# Patient Record
Sex: Male | Born: 1975 | Race: White | Hispanic: No | Marital: Single | Smoking: Former smoker
Health system: Southern US, Community
[De-identification: ages and names within clinical notes are randomized; demographics above are authoritative.]

---

## 2008-12-09 ENCOUNTER — Emergency Department (HOSPITAL_COMMUNITY): Admission: EM | Admit: 2008-12-09 | Discharge: 2008-12-09 | Payer: Self-pay | Admitting: Emergency Medicine

## 2009-08-26 ENCOUNTER — Emergency Department (HOSPITAL_COMMUNITY): Admission: EM | Admit: 2009-08-26 | Discharge: 2009-08-27 | Payer: Self-pay | Admitting: Emergency Medicine

## 2010-11-07 LAB — POCT I-STAT, CHEM 8
BUN: 12 mg/dL (ref 6–23)
Calcium, Ion: 1.1 mmol/L — ABNORMAL LOW (ref 1.12–1.32)
Creatinine, Ser: 1.2 mg/dL (ref 0.4–1.5)
HCT: 44 % (ref 39.0–52.0)
Hemoglobin: 15 g/dL (ref 13.0–17.0)
TCO2: 25 mmol/L (ref 0–100)

## 2011-08-05 ENCOUNTER — Ambulatory Visit (INDEPENDENT_AMBULATORY_CARE_PROVIDER_SITE_OTHER): Payer: BC Managed Care – PPO

## 2011-08-05 DIAGNOSIS — R05 Cough: Secondary | ICD-10-CM

## 2011-08-05 DIAGNOSIS — J45909 Unspecified asthma, uncomplicated: Secondary | ICD-10-CM

## 2011-11-04 ENCOUNTER — Encounter: Payer: Self-pay | Admitting: *Deleted

## 2011-11-04 ENCOUNTER — Ambulatory Visit (INDEPENDENT_AMBULATORY_CARE_PROVIDER_SITE_OTHER): Payer: BC Managed Care – PPO | Admitting: Internal Medicine

## 2011-11-04 VITALS — BP 116/80 | HR 71 | Temp 98.4°F | Resp 16 | Ht 74.0 in | Wt 229.0 lb

## 2011-11-04 DIAGNOSIS — F17200 Nicotine dependence, unspecified, uncomplicated: Secondary | ICD-10-CM

## 2011-11-04 DIAGNOSIS — J209 Acute bronchitis, unspecified: Secondary | ICD-10-CM

## 2011-11-04 DIAGNOSIS — J40 Bronchitis, not specified as acute or chronic: Secondary | ICD-10-CM

## 2011-11-04 MED ORDER — AZITHROMYCIN 250 MG PO TABS: ORAL_TABLET | ORAL | Status: AC

## 2011-11-04 NOTE — Progress Notes (Signed)
  Subjective:    Patient ID: Sean Roth, male    DOB: 23-Mar-1976, 36 y.o.   MRN: [id number]  Cough This is a new problem. The current episode started in the past 7 days. The problem has been unchanged. The problem occurs every few minutes. The cough is productive of sputum. Associated symptoms include nasal congestion and rhinorrhea. Pertinent negatives include no chest pain, chills, ear pain, fever, sore throat, shortness of breath or wheezing. The symptoms are aggravated by lying down. Risk factors for lung disease include smoking/tobacco exposure.  Sean Roth comes is for rhinitis and cough.  He has had this for 3 days and noticed his cough is worse, especially at night.  No frank wheezing or chest pain.  Smokes about half a pack of cigs daily.  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  Two years ago was diagnosed with pneumonia after initial treatment with Zpack; he was placed on Avelox and infection resolved.  States he is generally in good health.    Review of Systems  Constitutional: Negative for fever and chills.  HENT: Positive for rhinorrhea. Negative for ear pain and sore throat.   Respiratory: Positive for cough. Negative for shortness of breath and wheezing.   Cardiovascular: Negative for chest pain.  All other systems reviewed and are negative.       Objective:   Physical Exam  Vitals reviewed. Constitutional: He is oriented to person, place, and time. He appears well-developed and well-nourished.  HENT:  Head: Normocephalic.  Mouth/Throat: Oropharynx is clear and moist. No oropharyngeal exudate.       TM's clear bilaterally  Eyes: Conjunctivae are normal.  Cardiovascular: Normal rate, regular rhythm and normal heart sounds.   Pulmonary/Chest: Effort normal. No respiratory distress. He has no wheezes. He has no rales.       Course rhonchii on expiration both lung fields.  Neurological: He is alert and oriented to person, place, and time.  Skin: Skin is warm and dry.    Psychiatric: He has a normal mood and affect. His behavior is normal.          Assessment & Plan:  Bronchitis:  Zpack, and Mucinex during the day.  He has some Tessalon Perles to use at night if needed.  Encouraged to stop smoking!!

## 2011-11-04 NOTE — Patient Instructions (Signed)
Take your Azithromycin as prescribed.  Use Mucinex during the day and stay well hydrated.  Take the Tessalon Perles at night as needed.  Think about quitting smoking!  Bronchitis Bronchitis is the body's way of reacting to injury and/or infection (inflammation) of the bronchi. Bronchi are the air tubes that extend from the windpipe into the lungs. If the inflammation becomes severe, it may cause shortness of breath. CAUSES  Inflammation may be caused by:  A virus.   Germs (bacteria).   Dust.   Allergens.   Pollutants and many other irritants.  The cells lining the bronchial tree are covered with tiny hairs (cilia). These constantly beat upward, away from the lungs, toward the mouth. This keeps the lungs free of pollutants. When these cells become too irritated and are unable to do their job, mucus begins to develop. This causes the characteristic cough of bronchitis. The cough clears the lungs when the cilia are unable to do their job. Without either of these protective mechanisms, the mucus would settle in the lungs. Then you would develop pneumonia. Smoking is a common cause of bronchitis and can contribute to pneumonia. Stopping this habit is the single most important thing you can do to help yourself. TREATMENT   Your caregiver may prescribe an antibiotic if the cough is caused by bacteria. Also, medicines that open up your airways make it easier to breathe. Your caregiver may also recommend or prescribe an expectorant. It will loosen the mucus to be coughed up. Only take over-the-counter or prescription medicines for pain, discomfort, or fever as directed by your caregiver.   Removing whatever causes the problem (smoking, for example) is critical to preventing the problem from getting worse.   Cough suppressants may be prescribed for relief of cough symptoms.   Inhaled medicines may be prescribed to help with symptoms now and to help prevent problems from returning.   For those with  recurrent (chronic) bronchitis, there may be a need for steroid medicines.  SEEK IMMEDIATE MEDICAL CARE IF:   During treatment, you develop more pus-like mucus (purulent sputum).   You have a fever.   Your baby is older than 3 months with a rectal temperature of 102 F (38.9 C) or higher.   Your baby is 23 months old or younger with a rectal temperature of 100.4 F (38 C) or higher.   You become progressively more ill.   You have increased difficulty breathing, wheezing, or shortness of breath.  It is necessary to seek immediate medical care if you are elderly or sick from any other disease. MAKE SURE YOU:   Understand these instructions.   Will watch your condition.   Will get help right away if you are not doing well or get worse.  Document Released: 08/08/2005 Document Revised: 07/28/2011 Document Reviewed: 06/17/2008 [facility] Patient Information 2012 [place], [place].

## 2013-08-02 ENCOUNTER — Ambulatory Visit: Payer: BC Managed Care – PPO

## 2013-08-02 ENCOUNTER — Ambulatory Visit (INDEPENDENT_AMBULATORY_CARE_PROVIDER_SITE_OTHER): Payer: BC Managed Care – PPO | Admitting: Physician Assistant

## 2013-08-02 VITALS — BP 140/98 | HR 79 | Temp 97.9°F | Resp 16 | Ht 73.5 in | Wt 218.6 lb

## 2013-08-02 DIAGNOSIS — R05 Cough: Secondary | ICD-10-CM

## 2013-08-02 DIAGNOSIS — J40 Bronchitis, not specified as acute or chronic: Secondary | ICD-10-CM

## 2013-08-02 DIAGNOSIS — R062 Wheezing: Secondary | ICD-10-CM

## 2013-08-02 DIAGNOSIS — R509 Fever, unspecified: Secondary | ICD-10-CM

## 2013-08-02 MED ORDER — HYDROCODONE-HOMATROPINE 5-1.5 MG/5ML PO SYRP: ORAL_SOLUTION | ORAL | Status: AC

## 2013-08-02 MED ORDER — ALBUTEROL SULFATE HFA 108 (90 BASE) MCG/ACT IN AERS: 2.0000 | INHALATION_SPRAY | RESPIRATORY_TRACT | Status: AC | PRN

## 2013-08-02 MED ORDER — LEVOFLOXACIN 500 MG PO TABS: 500.0000 mg | ORAL_TABLET | Freq: Every day | ORAL | Status: AC

## 2013-08-02 NOTE — Progress Notes (Signed)
Patient ID: Sean Roth MRN: [id number], DOB: Jul 14, 1976, 37 y.o. Date of Encounter: 08/02/2013, 12:03 PM  Primary Physician: No primary provider on file.  Chief Complaint:  Chief Complaint  Patient presents with  . Cough    X 1-2 weeks  . Sore Throat    X last night  . Chest Congestion    X 2-3 weeks    HPI: 37 y.o. male presents with a 2-3 week history of nasal congestion, post nasal drip, sore throat, and cough. Mild sinus pressure. Subjective fever and chills. Nasal congestion thick and green/yellow. Cough is productive of green/yellow sputum and worse at nighttime. Some wheezing. No SOB. Ears feel full, leading to sensation of muffled hearing. Has tried OTC cold preps without success. No GI complaints. Appetite decreased. History of PNA several years ago. Was in the hospital for 1.5 days. No sick contacts, recent antibiotics, or recent travels. No leg trauma, sedentary periods, h/o cancer, or current tobacco use.  No past medical history on file.   Home Meds: Prior to Admission medications   Not on File    Allergies: No Known Allergies  History   Social History  . Marital Status: Single    Spouse Name: N/A    Number of Children: N/A  . Years of Education: N/A   Occupational History  . Not on file.   Social History Main Topics  . Smoking status: Former Smoker -- 0.20 packs/day    Types: Cigarettes    Quit date: 01/31/2013  . Smokeless tobacco: Not on file  . Alcohol Use: Yes  . Drug Use: No  . Sexual Activity: Not on file   Other Topics Concern  . Not on file   Social History Narrative  . No narrative on file     Review of Systems: Constitutional: positive for fever, chills, and fatigue HEENT: see above Cardiovascular: negative for chest pain or palpitations Respiratory: positive for cough and wheezing. negative for shortness of breath Abdominal: negative for abdominal pain, nausea, vomiting or diarrhea Dermatological: negative for  rash Neurologic: positive for headache   Physical Exam: Blood pressure 140/98, pulse 79, temperature 97.9 F (36.6 C), temperature source Oral, resp. rate 16, height 6' 1.5" (1.867 m), weight 218 lb 9.6 oz (99.156 kg), SpO2 96.00%., Body mass index is 28.45 kg/(m^2). General: Well developed, well nourished, in no acute distress. Head: Normocephalic, atraumatic, eyes without discharge, sclera non-icteric, nares are congested. Bilateral auditory canals clear, TM's are without perforation, pearly grey with reflective cone of light bilaterally. No sinus TTP. Oral cavity moist, dentition normal. Posterior pharynx with post nasal drip and mild erythema. No peritonsillar abscess or tonsillar exudate. Uvula midline.  Neck: Supple. No thyromegaly. Full ROM. Lymph nodes: less than 2 cm PC bilaterally. Lungs: Coarse breath sounds bilaterally without wheezes, rales, or rhonchi. Breathing is unlabored.  Heart: RRR with S1 S2. No murmurs, rubs, or gallops appreciated. Msk:  Strength and tone normal for age. Extremities: No clubbing or cyanosis. No edema. Neuro: Alert and oriented X 3. Moves all extremities spontaneously. CNII-XII grossly in tact. Psych:  Responds to questions appropriately with a normal affect.    CXR  [facility] reading (PRIMARY) by  Dr. Cleta Alberts. Negative.    ASSESSMENT AND PLAN:  37 y.o. male with bronchitis and cough.  -Levaquin 500 mg 1 po daily #10 no RF -Hycodan #4oz 1 tsp po q 4-6 hours prn cough no RF SED -Proventil 2 puffs inhaled q 4-6 hours prn #1 no RF -Mucinex -Tylenol/Motrin  prn -Rest/fluids -RTC precautions -RTC 3-5 days if no improvement  Signed, Eula Listen, PA-C [facility] [place], [place] [address] [phone] 08/02/2013 12:03 PM

## 2014-12-09 ENCOUNTER — Emergency Department (HOSPITAL_COMMUNITY): Payer: BLUE CROSS/BLUE SHIELD

## 2014-12-09 ENCOUNTER — Encounter (HOSPITAL_COMMUNITY): Payer: Self-pay | Admitting: *Deleted

## 2014-12-09 ENCOUNTER — Emergency Department (HOSPITAL_COMMUNITY)
Admission: EM | Admit: 2014-12-09 | Discharge: 2014-12-09 | Disposition: A | Discharge status: Home / Self Care | Payer: BLUE CROSS/BLUE SHIELD | Attending: Emergency Medicine | Admitting: Emergency Medicine

## 2014-12-09 DIAGNOSIS — Z87891 Personal history of nicotine dependence: Secondary | ICD-10-CM | POA: Insufficient documentation

## 2014-12-09 DIAGNOSIS — K529 Noninfective gastroenteritis and colitis, unspecified: Secondary | ICD-10-CM | POA: Insufficient documentation

## 2014-12-09 DIAGNOSIS — Z79899 Other long term (current) drug therapy: Secondary | ICD-10-CM | POA: Insufficient documentation

## 2014-12-09 DIAGNOSIS — R109 Unspecified abdominal pain: Secondary | ICD-10-CM | POA: Diagnosis present

## 2014-12-09 DIAGNOSIS — Z792 Long term (current) use of antibiotics: Secondary | ICD-10-CM | POA: Diagnosis not present

## 2014-12-09 LAB — COMPREHENSIVE METABOLIC PANEL
ALBUMIN: 4.7 g/dL (ref 3.5–5.2)
ALT: 27 U/L (ref 0–53)
AST: 25 U/L (ref 0–37)
Alkaline Phosphatase: 72 U/L (ref 39–117)
Anion gap: 10 (ref 5–15)
BILIRUBIN TOTAL: 0.7 mg/dL (ref 0.3–1.2)
BUN: 22 mg/dL (ref 6–23)
CHLORIDE: 106 mmol/L (ref 96–112)
CO2: 20 mmol/L (ref 19–32)
Calcium: 9.5 mg/dL (ref 8.4–10.5)
Creatinine, Ser: 1.13 mg/dL (ref 0.50–1.35)
GFR calc Af Amer: >90 mL/min (ref >90)
GFR, EST NON AFRICAN AMERICAN: 82 mL/min — AB (ref >90)
Glucose, Bld: 115 mg/dL — ABNORMAL HIGH (ref 70–99)
POTASSIUM: 4.3 mmol/L (ref 3.5–5.1)
SODIUM: 136 mmol/L (ref 135–145)
Total Protein: 8.1 g/dL (ref 6.0–8.3)

## 2014-12-09 LAB — I-STAT CG4 LACTIC ACID, ED: LACTIC ACID, VENOUS: 2.55 mmol/L — AB (ref 0.5–2.0)

## 2014-12-09 LAB — CBC WITH DIFFERENTIAL/PLATELET
Basophils Absolute: 0 10*3/uL (ref 0.0–0.1)
Basophils Relative: 0 % (ref 0–1)
Eosinophils Absolute: 0.2 10*3/uL (ref 0.0–0.7)
Eosinophils Relative: 1 % (ref 0–5)
HCT: 47.2 % (ref 39.0–52.0)
HEMOGLOBIN: 16.8 g/dL (ref 13.0–17.0)
LYMPHS ABS: 0.9 10*3/uL (ref 0.7–4.0)
Lymphocytes Relative: 5 % — ABNORMAL LOW (ref 12–46)
MCH: 30.4 pg (ref 26.0–34.0)
MCHC: 35.6 g/dL (ref 30.0–36.0)
MCV: 85.4 fL (ref 78.0–100.0)
MONOS PCT: 6 % (ref 3–12)
Monocytes Absolute: 1 10*3/uL (ref 0.1–1.0)
NEUTROS ABS: 14 10*3/uL — AB (ref 1.7–7.7)
NEUTROS PCT: 88 % — AB (ref 43–77)
PLATELETS: 243 10*3/uL (ref 150–400)
RBC: 5.53 MIL/uL (ref 4.22–5.81)
RDW: 12.5 % (ref 11.5–15.5)
WBC: 16.1 10*3/uL — AB (ref 4.0–10.5)

## 2014-12-09 LAB — LIPASE, BLOOD: LIPASE: 24 U/L (ref 11–59)

## 2014-12-09 LAB — CLOSTRIDIUM DIFFICILE BY PCR: CDIFFPCR: NEGATIVE

## 2014-12-09 MED ORDER — DIPHENOXYLATE-ATROPINE 2.5-0.025 MG PO TABS: 1.0000 | ORAL_TABLET | Freq: Four times a day (QID) | ORAL | Status: AC | PRN

## 2014-12-09 MED ORDER — MORPHINE SULFATE 4 MG/ML IJ SOLN
4.0000 mg | INTRAMUSCULAR | Status: DC | PRN
  Administered 2014-12-09 (×2): 4 mg via INTRAVENOUS
  Filled 2014-12-09 (×2): qty 1

## 2014-12-09 MED ORDER — ONDANSETRON HCL 4 MG/2ML IJ SOLN
4.0000 mg | Freq: Once | INTRAMUSCULAR | Status: AC
  Administered 2014-12-09: 4 mg via INTRAVENOUS

## 2014-12-09 MED ORDER — SODIUM CHLORIDE 0.9 % IV BOLUS (SEPSIS)
1000.0000 mL | Freq: Once | INTRAVENOUS | Status: AC
  Administered 2014-12-09: 1000 mL via INTRAVENOUS

## 2014-12-09 MED ORDER — DICYCLOMINE HCL 20 MG PO TABS: 20.0000 mg | ORAL_TABLET | Freq: Two times a day (BID) | ORAL | Status: AC

## 2014-12-09 MED ORDER — ONDANSETRON HCL 4 MG/2ML IJ SOLN
4.0000 mg | Freq: Once | INTRAMUSCULAR | Status: DC
  Filled 2014-12-09: qty 2

## 2014-12-09 MED ORDER — ONDANSETRON 4 MG PO TBDP: 4.0000 mg | ORAL_TABLET | Freq: Three times a day (TID) | ORAL | Status: DC | PRN

## 2014-12-09 NOTE — ED Notes (Addendum)
Pt reports was on cephelexin for strep throat, finished last dose on Friday 4/15. Left sided flank pain, vomiting and diarrhea since this morning. Pain 10/10. Diarrhea episodes 15x since 0500. Actively vomiting in triage.

## 2014-12-09 NOTE — ED Provider Notes (Signed)
CSN: [id number]     Arrival date & time 12/09/14  [id number] History   First MD Initiated Contact with Patient 12/09/14 [phone]     Chief Complaint  Patient presents with  . Flank Pain  . Diarrhea  . Emesis      HPI  Patient presents for evaluation of abdominal pain. Also reports nausea vomiting diarrhea since early this morning. Several days ago finished 10 days of Keflex for a sore throat. Describes severe abdominal cramping. Nonlocalizing. Hurts to walk. No relief with bowel movement or emesis. Denies blood pus or mucus in his stools. Heme-negative nonbilious emesis. Pain is more left-sided than right. Has never had an episode of colitis, or known diverticuli.  History reviewed. No pertinent past medical history. History reviewed. No pertinent past surgical history. History reviewed. No pertinent family history. History  Substance Use Topics  . Smoking status: Former Smoker -- 0.20 packs/day    Types: Cigarettes    Quit date: 01/31/2013  . Smokeless tobacco: Not on file  . Alcohol Use: Yes    Review of Systems  Constitutional: Negative for fever, chills, diaphoresis, appetite change and fatigue.  HENT: Negative for mouth sores, sore throat and trouble swallowing.   Eyes: Negative for visual disturbance.  Respiratory: Negative for cough, chest tightness, shortness of breath and wheezing.   Cardiovascular: Negative for chest pain.  Gastrointestinal: Positive for nausea, vomiting, abdominal pain and diarrhea. Negative for abdominal distention.  Endocrine: Negative for polydipsia, polyphagia and polyuria.  Genitourinary: Negative for dysuria, frequency and hematuria.  Musculoskeletal: Negative for gait problem.  Skin: Negative for color change, pallor and rash.  Neurological: Negative for dizziness, syncope, light-headedness and headaches.  Hematological: Does not bruise/bleed easily.  Psychiatric/Behavioral: Negative for behavioral problems and confusion.      Allergies  Review  of patient's allergies indicates no known allergies.  Home Medications   Prior to Admission medications   Medication Sig Start Date End Date Taking? Authorizing Provider  ibuprofen (ADVIL,MOTRIN) 200 MG tablet Take 400 mg by mouth every 4 (four) hours as needed (for pain).   Yes Historical Provider, MD  terbinafine (LAMISIL) 250 MG tablet Take 250 mg by mouth daily.   Yes Historical Provider, MD  albuterol (PROVENTIL HFA;VENTOLIN HFA) 108 (90 BASE) MCG/ACT inhaler Inhale 2 puffs into the lungs every 4 (four) hours as needed for wheezing or shortness of breath. Patient not taking: Reported on 12/09/2014 08/02/13   Raymon Muttonyan M Dunn, PA-C  dicyclomine (BENTYL) 20 MG tablet Take 1 tablet (20 mg total) by mouth 2 (two) times daily. 12/09/14   Rolland PorterMark Rashard Ryle, MD  diphenoxylate-atropine (LOMOTIL) 2.5-0.025 MG per tablet Take 1 tablet by mouth 4 (four) times daily as needed for diarrhea or loose stools. 12/09/14   Rolland PorterMark Shivan Hodes, MD  HYDROcodone-homatropine [facility](HYCODAN) 5-1.5 MG/5ML syrup 1 TSP PO Q 4-6 HOURS PRN COUGH Patient not taking: Reported on 12/09/2014 08/02/13   Raymon Muttonyan M Dunn, PA-C  levofloxacin (LEVAQUIN) 500 MG tablet Take 1 tablet (500 mg total) by mouth daily. Patient not taking: Reported on 12/09/2014 08/02/13   Raymon Muttonyan M Dunn, PA-C  ondansetron (ZOFRAN ODT) 4 MG disintegrating tablet Take 1 tablet (4 mg total) by mouth every 8 (eight) hours as needed for nausea. 12/09/14   Rolland PorterMark Luie Laneve, MD   BP 129/71 mmHg  Pulse 110  Temp(Src) 97.6 F (36.4 C) (Oral)  Resp 18  SpO2 94% Physical Exam  Constitutional: He is oriented to person, place, and time. He appears well-developed and well-nourished. No distress.  HENT:  Head: Normocephalic.  Eyes: Conjunctivae are normal. Pupils are equal, round, and reactive to light. No scleral icterus.  Neck: Normal range of motion. Neck supple. No thyromegaly present.  Cardiovascular: Normal rate and regular rhythm.  Exam reveals no gallop and no friction rub.   No murmur  heard. Pulmonary/Chest: Effort normal and breath sounds normal. No respiratory distress. He has no wheezes. He has no rales.  Abdominal: Soft. Bowel sounds are normal. He exhibits no distension. There is no tenderness. There is no rebound.    Musculoskeletal: Normal range of motion.  Neurological: He is alert and oriented to person, place, and time.  Skin: Skin is warm and dry. No rash noted.  Psychiatric: He has a normal mood and affect. His behavior is normal.    ED Course  Procedures (including critical care time) Labs Review Labs Reviewed  CBC WITH DIFFERENTIAL/PLATELET - Abnormal; Notable for the following:    WBC 16.1 (*)    Neutrophils Relative % 88 (*)    Neutro Abs 14.0 (*)    Lymphocytes Relative 5 (*)    All other components within normal limits  COMPREHENSIVE METABOLIC PANEL - Abnormal; Notable for the following:    Glucose, Bld 115 (*)    GFR calc non Af Amer 82 (*)    All other components within normal limits  I-STAT CG4 LACTIC ACID, ED - Abnormal; Notable for the following:    Lactic Acid, Venous 2.55 (*)    All other components within normal limits  CLOSTRIDIUM DIFFICILE BY PCR  LIPASE, BLOOD    Imaging Review No results found.   EKG Interpretation None      MDM   Final diagnoses:  AP (abdominal pain)  Gastroenteritis     Patient given antiemetics and fluids. Has a leukocytosis. Continues with pain although somewhat intermittent now. CT scan shows signs of enteritis. No signs of localizing colitis, or diverticuli. No free fluid or free air. Plan symptomatic treatment. Discharge home, Zofran, Lomotil, Bentyl and clear liquids, slowly advancing his diet. Recheck with any worsening symptoms including worsening pain, excessive vomiting. Bloody stools, bloody emesis high fever or other changes.   Rolland Porter, MD 12/11/14 1728

## 2014-12-09 NOTE — Discharge Instructions (Signed)
Viral Gastroenteritis Viral gastroenteritis is also called stomach flu. This illness is caused by a certain type of germ (virus). It can cause sudden watery poop (diarrhea) and throwing up (vomiting). This can cause you to lose body fluids (dehydration). This illness usually lasts for 3 to 8 days. It usually goes away on its own. HOME CARE   Drink enough fluids to keep your pee (urine) clear or pale yellow. Drink small amounts of fluids often.  Ask your doctor how to replace body fluid losses (rehydration).  Avoid:  Foods high in sugar.  Alcohol.  Bubbly (carbonated) drinks.  Tobacco.  Juice.  Caffeine drinks.  Very hot or cold fluids.  Fatty, greasy foods.  Eating too much at one time.  Dairy products until 24 to 48 hours after your watery poop stops.  You may eat foods with active cultures (probiotics). They can be found in some yogurts and supplements.  Wash your hands well to avoid spreading the illness.  Only take medicines as told by your doctor. Do not give aspirin to children. Do not take medicines for watery poop (antidiarrheals).  Ask your doctor if you should keep taking your regular medicines.  Keep all doctor visits as told. GET HELP RIGHT AWAY IF:   You cannot keep fluids down.  You do not pee at least once every 6 to 8 hours.  You are short of breath.  You see blood in your poop or throw up. This may look like coffee grounds.  You have belly (abdominal) pain that gets worse or is just in one small spot (localized).  You keep throwing up or having watery poop.  You have a fever.  The patient is a child younger than 3 months, and he or she has a fever.  The patient is a child older than 3 months, and he or she has a fever and problems that do not go away.  The patient is a child older than 3 months, and he or she has a fever and problems that suddenly get worse.  The patient is a baby, and he or she has no tears when crying. MAKE SURE YOU:     Understand these instructions.  Will watch your condition.  Will get help right away if you are not doing well or get worse. Document Released: 01/25/2008 Document Revised: 10/31/2011 Document Reviewed: 05/25/2011 [employer] Patient Information 2015 [employer]. This information is not intended to replace advice given to you by your health care provider. Make sure you discuss any questions you have with your health care provider.  

## 2014-12-09 NOTE — ED Notes (Signed)
Patient unable to urinate at this time.  Will attempt again shortly

## 2017-10-31 ENCOUNTER — Ambulatory Visit
Admission: RE | Admit: 2017-10-31 | Discharge: 2017-10-31 | Disposition: A | Discharge status: Home / Self Care | Payer: BLUE CROSS/BLUE SHIELD | Source: Ambulatory Visit | Attending: Family Medicine | Admitting: Family Medicine

## 2017-10-31 ENCOUNTER — Other Ambulatory Visit: Payer: Self-pay | Admitting: Family Medicine

## 2017-10-31 DIAGNOSIS — R52 Pain, unspecified: Secondary | ICD-10-CM

## 2017-12-29 ENCOUNTER — Encounter (HOSPITAL_COMMUNITY): Payer: Self-pay

## 2017-12-29 ENCOUNTER — Emergency Department (HOSPITAL_COMMUNITY)
Admission: EM | Admit: 2017-12-29 | Discharge: 2017-12-29 | Disposition: A | Discharge status: Home / Self Care | Payer: BLUE CROSS/BLUE SHIELD | Attending: Emergency Medicine | Admitting: Emergency Medicine

## 2017-12-29 DIAGNOSIS — Z87891 Personal history of nicotine dependence: Secondary | ICD-10-CM | POA: Insufficient documentation

## 2017-12-29 DIAGNOSIS — L03211 Cellulitis of face: Secondary | ICD-10-CM | POA: Insufficient documentation

## 2017-12-29 DIAGNOSIS — Z79899 Other long term (current) drug therapy: Secondary | ICD-10-CM | POA: Insufficient documentation

## 2017-12-29 MED ORDER — SULFAMETHOXAZOLE-TRIMETHOPRIM 800-160 MG PO TABS: 1.0000 | ORAL_TABLET | Freq: Two times a day (BID) | ORAL | 0 refills | Status: AC

## 2017-12-29 MED ORDER — CEPHALEXIN 500 MG PO CAPS: 500.0000 mg | ORAL_CAPSULE | Freq: Four times a day (QID) | ORAL | 0 refills | Status: AC

## 2017-12-29 MED ORDER — ACETAMINOPHEN 500 MG PO TABS
1000.0000 mg | ORAL_TABLET | Freq: Once | ORAL | Status: AC
  Administered 2017-12-29: 1000 mg via ORAL
  Filled 2017-12-29: qty 2

## 2017-12-29 NOTE — ED Provider Notes (Signed)
[facility] EMERGENCY DEPARTMENT Provider Note   CSN: [id number] Arrival date & time: 12/29/17  1249     History   Chief Complaint Chief Complaint  Patient presents with  . Facial Swelling    HPI Sean Roth is a 42 y.o. male.  HPI  Patient is a 42 year old male with no significant past medical history presenting for draining lesion of the lower lip and surrounding redness.  Patient reports that he had a small lesion that "popped" on his lower lip yesterday, and had a small amount of purulent drainage.  Patient reports subsequent serous fluid and crusting.  Patient denies any lesions of the mouth, other lesions of the body, desquamating rashes, or taking any medications.  Patient denies any fever or chills.  Patient denies shortness of breath, abdominal pain, urticaria.  Patient denies IVDU.  No remedies tried for symptoms.  Tetanus shot is up-to-date.  History reviewed. No pertinent past medical history.  There are no active problems to display for this patient.   History reviewed. No pertinent surgical history.      Home Medications    Prior to Admission medications   Medication Sig Start Date End Date Taking? Authorizing Provider  albuterol (PROVENTIL HFA;VENTOLIN HFA) 108 (90 BASE) MCG/ACT inhaler Inhale 2 puffs into the lungs every 4 (four) hours as needed for wheezing or shortness of breath. Patient not taking: Reported on 12/09/2014 08/02/13   Sondra Barges, PA-C  dicyclomine (BENTYL) 20 MG tablet Take 1 tablet (20 mg total) by mouth 2 (two) times daily. 12/09/14   Rolland Porter, MD  diphenoxylate-atropine (LOMOTIL) 2.5-0.025 MG per tablet Take 1 tablet by mouth 4 (four) times daily as needed for diarrhea or loose stools. 12/09/14   Rolland Porter, MD  HYDROcodone-homatropine [facility]) 5-1.5 MG/5ML syrup 1 TSP PO Q 4-6 HOURS PRN COUGH Patient not taking: Reported on 12/09/2014 08/02/13   Sondra Barges, PA-C  ibuprofen (ADVIL,MOTRIN) 200 MG tablet Take 400 mg  by mouth every 4 (four) hours as needed (for pain).    [provider]  levofloxacin (LEVAQUIN) 500 MG tablet Take 1 tablet (500 mg total) by mouth daily. Patient not taking: Reported on 12/09/2014 08/02/13   Sondra Barges, PA-C  ondansetron (ZOFRAN ODT) 4 MG disintegrating tablet Take 1 tablet (4 mg total) by mouth every 8 (eight) hours as needed for nausea. 12/09/14   Rolland Porter, MD  terbinafine (LAMISIL) 250 MG tablet Take 250 mg by mouth daily.    [provider]    Family History No family history on file.  Social History Social History   Tobacco Use  . Smoking status: Former Smoker    Packs/day: 0.20    Types: Cigarettes    Last attempt to quit: 01/31/2013    Years since quitting: 4.9  Substance Use Topics  . Alcohol use: Yes  . Drug use: No     Allergies   Patient has no known allergies.   Review of Systems Review of Systems  Constitutional: Negative for chills and fever.  HENT: Positive for facial swelling.   Skin: Positive for color change.     Physical Exam Updated Vital Signs BP 116/89 (BP Location: Right Arm)   Pulse 85   Temp 98.5 F (36.9 C) (Oral)   Resp 16   SpO2 99%   Physical Exam  Constitutional: He appears well-developed and well-nourished. No distress.  Sitting comfortably in bed.  HENT:  Head: Normocephalic and atraumatic.  There is a swollen  area inferior to the lower lip on the right with overlying crusting.  Some serous drainage.  No fluctuance noted.  Induration of the entire nodule.  There are no intraoral lesions. No intraoral abscesses.There is no erythema or asymmetric swelling of the face.  Oropharynx is clear without swelling.   Eyes: Conjunctivae are normal. Right eye exhibits no discharge. Left eye exhibits no discharge.  EOMs normal to gross examination.  Neck: Normal range of motion.  Cardiovascular: Normal rate and regular rhythm.  Intact, 2+ radial pulse.  Pulmonary/Chest:  Normal respiratory effort.  Patient converses comfortably. No audible wheeze or stridor.  Abdominal: He exhibits no distension.  Musculoskeletal: Normal range of motion.  Neurological: He is alert.  Cranial nerves intact to gross observation. Patient moves extremities without difficulty.  Skin: Skin is warm and dry. He is not diaphoretic.  Psychiatric: He has a normal mood and affect. His behavior is normal. Judgment and thought content normal.  Nursing note and vitals reviewed.    ED Treatments / Results  Labs (all labs ordered are listed, but only abnormal results are displayed) Labs Reviewed - No data to display  EKG None  Radiology No results found.  Procedures Procedures (including critical care time)  Medications Ordered in ED Medications  acetaminophen (TYLENOL) tablet 1,000 mg (1,000 mg Oral Given 12/29/17 1337)     Initial Impression / Assessment and Plan / ED Course  I have reviewed the triage vital signs and the nursing notes.  Pertinent labs & imaging results that were available during my care of the patient were reviewed by me and considered in my medical decision making (see chart for details).     Patient nontoxic-appearing, afebrile, and in no acute distress.  Airway is intact, and facial swelling does not appear to involve upper lip, or the oropharynx.  Do not suspect angioedema of the lips, as patient is not on any medications that would precipitate this, takes no medicines, and swelling is asymmetric on the lips.  No other allergic symptoms such as shortness of breath, abdominal cramping, or urticaria.  Suspect cellulitis and abscess of face.  Appears actively draining, with no further indication for I&D.  Will prescribe Bactrim/Keflex, and encourage patient to perform warm compresses.  Return precautions given for any swelling of the mouth or face, redevelopment of abscess, or spreading erythema.  Patient is in understanding and agrees with the plan of care.  Final Clinical  Impressions(s) / ED Diagnoses   Final diagnoses:  Facial cellulitis    ED Discharge Orders        Ordered    sulfamethoxazole-trimethoprim (BACTRIM DS,SEPTRA DS) 800-160 MG tablet  2 times daily     12/29/17 1628    cephALEXin (KEFLEX) 500 MG capsule  4 times daily     12/29/17 1628       Delia Chimes 12/29/17 1946    Nira Conn, MD 12/30/17 [phone]

## 2017-12-29 NOTE — ED Triage Notes (Signed)
Pt presents for lip swelling. States he woke up with swelling this AM. No new foods or medications. States he felt he had a small bump to R lower lip last night but no swelling. States lip is painful. Patient has some drainage from lip and chin. Reports pain with swallowing. VSS.

## 2017-12-29 NOTE — Discharge Instructions (Addendum)
Please see the information and instructions below regarding your visit.  Your diagnoses today include:  1. Facial cellulitis    Cellulitis is a superficial skin infection. Please take your antibiotics as prescribed for their ENTIRE prescribed duration.   Tests performed today include: See side panel of your discharge paperwork for testing performed today. Vital signs are listed at the bottom of these instructions.   Medications prescribed:    Take any prescribed medications only as prescribed, and any over the counter medications only as directed on the packaging.  1. Please take all of your antibiotics until finished.   You may develop abdominal discomfort or nausea from the antibiotic. If this occurs, you may take it with food. Some patients also get diarrhea with antibiotics. You may help offset this with probiotics which you can buy or get in yogurt. Do not eat or take the probiotics until 2 hours after your antibiotic. Some women develop vaginal yeast infections after antibiotics. If you develop unusual vaginal discharge after being on this medication, please see your primary care provider.   Some people develop allergies to antibiotics. Symptoms of antibiotic allergy can be mild and include a flat rash and itching. They can also be more serious and include:  ?Hives - Hives are raised, red patches of skin that are usually very itchy.  ?Lip or tongue swelling  ?Trouble swallowing or breathing  ?Blistering of the skin or mouth.  If you have any of these serious symptoms, please seek emergency medical care immediately.  Home care instructions:  Please follow any educational materials contained in this packet.    Keep affected area above the level of your heart when possible. Wash area gently twice a day with warm soapy water. Do not apply alcohol or hydrogen peroxide. Cover the area if it draining or weeping.   Please apply warm compresses 4 times daily on this area to promote  drainage.  Follow-up instructions: Please follow-up with your primary care provider or the ED in 48 hours for a check of the infection if symptoms are not improving.   Return instructions:  Please return to the Emergency Department if you experience worsening symptoms. Call your doctor sooner or return to the ER if you develop worsening signs of infection such as: increased redness, increased pain, pus, fever, or other symptoms that concern you. Please monitor the area we marked with a pen today. Please return if you have any other emergent concerns.  Additional Information:   Your vital signs today were: BP 116/89 (BP Location: Right Arm)    Pulse 85    Temp 98.5 F (36.9 C) (Oral)    Resp 16    SpO2 99%  If your blood pressure (BP) was elevated on multiple readings during this visit above 130 for the top number or above 80 for the bottom number, please have this repeated by your primary care provider within one month. --------------  Thank you for allowing [place] to participate in your care today. It was a pleasure taking care of you today!

## 2017-12-29 NOTE — ED Provider Notes (Signed)
Patient placed in Quick Look pathway, seen and evaluated   Chief Complaint: Facial swelling  HPI:   Patient presents today for evaluation of right-sided lip swelling.  He reports that he had a small bump on his right lower lip last night however there was not swelling.  He reports that when he woke up this morning it was swollen, and painful.  He has wounds on his lip and chin with honey colored crusting.   ROS: No fevers or chills  Physical Exam:   Gen: No distress  Neuro: Awake and Alert  Skin: Warm    Focused Exam: Right lower lip is swollen with generalized area of tenderness to palpation, hot honey colored crusting, and open/draining wounds.  No obvious purulent drainage.  There is mucous membrane disruption on the right buccal mucosa.  Patient refused clinical image capture.   Initiation of care has begun. The patient has been counseled on the process, plan, and necessity for staying for the completion/evaluation, and the remainder of the medical screening examination.  He was informed that leaving after this evaluation would be considered AMA.     Cristina Gong, PA-C 12/29/17 1345    Eber Hong, MD 12/30/17 [phone]

## 2019-07-06 ENCOUNTER — Emergency Department (HOSPITAL_COMMUNITY): Payer: No Typology Code available for payment source

## 2019-07-06 ENCOUNTER — Emergency Department (HOSPITAL_COMMUNITY)
Admission: EM | Admit: 2019-07-06 | Discharge: 2019-07-07 | Disposition: A | Discharge status: Home / Self Care | Payer: No Typology Code available for payment source | Attending: Emergency Medicine | Admitting: Emergency Medicine

## 2019-07-06 ENCOUNTER — Other Ambulatory Visit: Payer: Self-pay

## 2019-07-06 ENCOUNTER — Encounter (HOSPITAL_COMMUNITY): Payer: Self-pay | Admitting: Emergency Medicine

## 2019-07-06 DIAGNOSIS — Z79899 Other long term (current) drug therapy: Secondary | ICD-10-CM | POA: Insufficient documentation

## 2019-07-06 DIAGNOSIS — M25562 Pain in left knee: Secondary | ICD-10-CM | POA: Insufficient documentation

## 2019-07-06 DIAGNOSIS — Y999 Unspecified external cause status: Secondary | ICD-10-CM | POA: Diagnosis not present

## 2019-07-06 DIAGNOSIS — Y939 Activity, unspecified: Secondary | ICD-10-CM | POA: Insufficient documentation

## 2019-07-06 DIAGNOSIS — S8001XA Contusion of right knee, initial encounter: Secondary | ICD-10-CM | POA: Diagnosis not present

## 2019-07-06 DIAGNOSIS — S50311A Abrasion of right elbow, initial encounter: Secondary | ICD-10-CM | POA: Diagnosis not present

## 2019-07-06 DIAGNOSIS — Z23 Encounter for immunization: Secondary | ICD-10-CM | POA: Insufficient documentation

## 2019-07-06 DIAGNOSIS — S81032A Puncture wound without foreign body, left knee, initial encounter: Secondary | ICD-10-CM

## 2019-07-06 DIAGNOSIS — S71131A Puncture wound without foreign body, right thigh, initial encounter: Secondary | ICD-10-CM | POA: Diagnosis not present

## 2019-07-06 DIAGNOSIS — Y929 Unspecified place or not applicable: Secondary | ICD-10-CM | POA: Insufficient documentation

## 2019-07-06 DIAGNOSIS — Z87891 Personal history of nicotine dependence: Secondary | ICD-10-CM | POA: Insufficient documentation

## 2019-07-06 MED ORDER — TETANUS-DIPHTH-ACELL PERTUSSIS 5-2.5-18.5 LF-MCG/0.5 IM SUSP
0.5000 mL | Freq: Once | INTRAMUSCULAR | Status: AC
  Administered 2019-07-06: 0.5 mL via INTRAMUSCULAR
  Filled 2019-07-06: qty 0.5

## 2019-07-06 NOTE — ED Notes (Signed)
Patient transported to X-ray 
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
## 2019-07-07 MED ORDER — ACETAMINOPHEN 500 MG PO TABS: 1000.0000 mg | ORAL_TABLET | Freq: Three times a day (TID) | ORAL | 0 refills | Status: AC | PRN

## 2019-07-07 MED ORDER — CEPHALEXIN 500 MG PO CAPS: 500.0000 mg | ORAL_CAPSULE | Freq: Four times a day (QID) | ORAL | 0 refills | Status: AC

## 2019-07-07 NOTE — Discharge Instructions (Addendum)
Take 1 g of Tylenol every 8 hours for your pain.  Avoid NSAID medications such as ibuprofen, Aleve, Advil, Motrin considering your hematoma and this can cause increase in bleeding.  Use ice 3-4 times daily alternating 20 minutes on, 20 minutes off.  Elevate your leg whenever you are not walking on it.  Make sure to walk with crutches and do not bear weight on your leg.  Please follow-up with orthopedics next week for recheck.  Please return the emergency department if you develop any new or worsening symptoms including increasing pain, redness, swelling, red streaking from the wounds, or any other concerning symptoms.

## 2019-07-07 NOTE — ED Notes (Signed)
Patient verbalizes understanding of discharge instructions. Opportunity for questioning and answers were provided. Armband removed by staff, pt discharged from ED.  
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
## 2023-05-29 ENCOUNTER — Emergency Department (HOSPITAL_BASED_OUTPATIENT_CLINIC_OR_DEPARTMENT_OTHER)
Admission: EM | Admit: 2023-05-29 | Discharge: 2023-05-29 | Disposition: A | Discharge status: Home / Self Care | Payer: BC Managed Care – PPO | Attending: Emergency Medicine | Admitting: Emergency Medicine

## 2023-05-29 ENCOUNTER — Other Ambulatory Visit (HOSPITAL_BASED_OUTPATIENT_CLINIC_OR_DEPARTMENT_OTHER): Payer: Self-pay

## 2023-05-29 ENCOUNTER — Other Ambulatory Visit: Payer: Self-pay

## 2023-05-29 ENCOUNTER — Encounter (HOSPITAL_BASED_OUTPATIENT_CLINIC_OR_DEPARTMENT_OTHER): Payer: Self-pay | Admitting: Emergency Medicine

## 2023-05-29 ENCOUNTER — Emergency Department (HOSPITAL_BASED_OUTPATIENT_CLINIC_OR_DEPARTMENT_OTHER): Payer: BC Managed Care – PPO

## 2023-05-29 DIAGNOSIS — K5732 Diverticulitis of large intestine without perforation or abscess without bleeding: Secondary | ICD-10-CM | POA: Diagnosis not present

## 2023-05-29 DIAGNOSIS — R109 Unspecified abdominal pain: Secondary | ICD-10-CM

## 2023-05-29 DIAGNOSIS — K5792 Diverticulitis of intestine, part unspecified, without perforation or abscess without bleeding: Secondary | ICD-10-CM

## 2023-05-29 DIAGNOSIS — R1030 Lower abdominal pain, unspecified: Secondary | ICD-10-CM | POA: Diagnosis present

## 2023-05-29 LAB — COMPREHENSIVE METABOLIC PANEL
ALT: 18 U/L (ref 0–44)
AST: 17 U/L (ref 15–41)
Albumin: 4.2 g/dL (ref 3.5–5.0)
Alkaline Phosphatase: 46 U/L (ref 38–126)
Anion gap: 12 (ref 5–15)
BUN: 18 mg/dL (ref 6–20)
CO2: 25 mmol/L (ref 22–32)
Calcium: 9 mg/dL (ref 8.9–10.3)
Chloride: 98 mmol/L (ref 98–111)
Creatinine, Ser: 0.97 mg/dL (ref 0.61–1.24)
GFR, Estimated: >60 mL/min (ref >60)
Glucose, Bld: 89 mg/dL (ref 70–99)
Potassium: 4 mmol/L (ref 3.5–5.1)
Sodium: 135 mmol/L (ref 135–145)
Total Bilirubin: 1 mg/dL (ref 0.3–1.2)
Total Protein: 7.5 g/dL (ref 6.5–8.1)

## 2023-05-29 LAB — CBC
HCT: 43.8 % (ref 39.0–52.0)
Hemoglobin: 15.5 g/dL (ref 13.0–17.0)
MCH: 30.5 pg (ref 26.0–34.0)
MCHC: 35.4 g/dL (ref 30.0–36.0)
MCV: 86.2 fL (ref 80.0–100.0)
Platelets: 228 10*3/uL (ref 150–400)
RBC: 5.08 MIL/uL (ref 4.22–5.81)
RDW: 12 % (ref 11.5–15.5)
WBC: 8.8 10*3/uL (ref 4.0–10.5)
nRBC: 0 % (ref 0.0–0.2)

## 2023-05-29 LAB — URINALYSIS, ROUTINE W REFLEX MICROSCOPIC
Bilirubin Urine: NEGATIVE
Glucose, UA: NEGATIVE mg/dL
Hgb urine dipstick: NEGATIVE
Ketones, ur: NEGATIVE mg/dL
Leukocytes,Ua: NEGATIVE
Nitrite: NEGATIVE
Protein, ur: NEGATIVE mg/dL
Specific Gravity, Urine: <=1.005 (ref 1.005–1.030)
pH: 5.5 (ref 5.0–8.0)

## 2023-05-29 LAB — LIPASE, BLOOD: Lipase: 36 U/L (ref 11–51)

## 2023-05-29 MED ORDER — IOHEXOL 300 MG/ML  SOLN
100.0000 mL | Freq: Once | INTRAMUSCULAR | Status: AC | PRN
  Administered 2023-05-29: 100 mL via INTRAVENOUS

## 2023-05-29 MED ORDER — AMOXICILLIN-POT CLAVULANATE 875-125 MG PO TABS
1.0000 | ORAL_TABLET | Freq: Two times a day (BID) | ORAL | 0 refills | Status: AC
  Filled 2023-05-29: qty 14, 7d supply, fill #0

## 2023-05-29 MED ORDER — ONDANSETRON 4 MG PO TBDP
4.0000 mg | ORAL_TABLET | Freq: Three times a day (TID) | ORAL | 0 refills | Status: AC | PRN
  Filled 2023-05-29: qty 20, 7d supply, fill #0

## 2023-05-29 NOTE — ED Triage Notes (Addendum)
Abd pain and feel bloasted just did a 40 hour fast ate wed night and now still left abd pain, pain goes around to back hurts to pee  no hx of kidney stones  has had n/d states hurts to cough sneeze and laugh

## 2023-05-29 NOTE — ED Provider Notes (Addendum)
[place] [facility] DEPARTMENT AT [facility] Provider Note   CSN: [id number] Arrival date & time: 05/29/23  1134     History  Chief Complaint  Patient presents with   Abdominal Pain    Sean Roth is a 47 y.o. male.  Patient here with lower abdominal pain.  Symptoms for the last few days.  Denies history of kidney stones.  He has had a little bit of nausea diarrhea.  He just did a 40-hour fast recently.  No significant medical history.  No significant surgical history.  She is not having any pain with urination.  No nausea currently.  No history of diverticulitis.  The history is provided by the patient.       Home Medications Prior to Admission medications   Medication Sig Start Date End Date Taking? Authorizing Provider  amoxicillin-clavulanate (AUGMENTIN) 875-125 MG tablet Take 1 tablet by mouth every 12 (twelve) hours. 05/29/23  Yes Stratton Villwock, DO  ondansetron (ZOFRAN-ODT) 4 MG disintegrating tablet Take 1 tablet (4 mg total) by mouth every 8 (eight) hours as needed. 05/29/23  Yes Mersadie Kavanaugh, DO  acetaminophen (TYLENOL) 500 MG tablet Take 2 tablets (1,000 mg total) by mouth every 8 (eight) hours as needed for moderate pain. 07/07/19   Law, Waylan Boga, PA-C  albuterol (PROVENTIL HFA;VENTOLIN HFA) 108 (90 BASE) MCG/ACT inhaler Inhale 2 puffs into the lungs every 4 (four) hours as needed for wheezing or shortness of breath. Patient not taking: Reported on 12/09/2014 08/02/13   Sondra Barges, PA-C  cephALEXin (KEFLEX) 500 MG capsule Take 1 capsule (500 mg total) by mouth 4 (four) times daily. 07/07/19   Law, Waylan Boga, PA-C  dicyclomine (BENTYL) 20 MG tablet Take 1 tablet (20 mg total) by mouth 2 (two) times daily. 12/09/14   Rolland Porter, MD  diphenoxylate-atropine (LOMOTIL) 2.5-0.025 MG per tablet Take 1 tablet by mouth 4 (four) times daily as needed for diarrhea or loose stools. 12/09/14   Rolland Porter, MD  HYDROcodone-homatropine [facility]) 5-1.5 MG/5ML syrup 1  TSP PO Q 4-6 HOURS PRN COUGH Patient not taking: Reported on 12/09/2014 08/02/13   Sondra Barges, PA-C  ibuprofen (ADVIL,MOTRIN) 200 MG tablet Take 400 mg by mouth every 4 (four) hours as needed (for pain).    [provider]  levofloxacin (LEVAQUIN) 500 MG tablet Take 1 tablet (500 mg total) by mouth daily. Patient not taking: Reported on 12/09/2014 08/02/13   Sondra Barges, PA-C  terbinafine (LAMISIL) 250 MG tablet Take 250 mg by mouth daily.    [provider]      Allergies    Patient has no known allergies.    Review of Systems   Review of Systems  Physical Exam Updated Vital Signs BP 117/79   Pulse (!) 58   Temp 98.3 F (36.8 C)   Resp 17   Ht 6\' 1"  (1.854 m)   Wt 83.5 kg   SpO2 100%   BMI 24.28 kg/m  Physical Exam Vitals and nursing note reviewed.  Constitutional:      General: He is not in acute distress.    Appearance: He is well-developed. He is not ill-appearing.  HENT:     Head: Normocephalic and atraumatic.     Mouth/Throat:     Mouth: Mucous membranes are moist.  Eyes:     Extraocular Movements: Extraocular movements intact.     Conjunctiva/sclera: Conjunctivae normal.  Cardiovascular:     Rate and Rhythm: Normal rate and regular rhythm.  Heart sounds: Normal heart sounds. No murmur heard. Pulmonary:     Effort: Pulmonary effort is normal. No respiratory distress.     Breath sounds: Normal breath sounds.  Abdominal:     Palpations: Abdomen is soft.     Tenderness: There is abdominal tenderness in the left lower quadrant.  Musculoskeletal:        General: No swelling.     Cervical back: Neck supple.  Skin:    General: Skin is warm and dry.     Capillary Refill: Capillary refill takes less than 2 seconds.  Neurological:     Mental Status: He is alert.  Psychiatric:        Mood and Affect: Mood normal.     ED Results / Procedures / Treatments   Labs (all labs ordered are listed, but only abnormal results are displayed) Labs  Reviewed  LIPASE, BLOOD  COMPREHENSIVE METABOLIC PANEL  CBC  URINALYSIS, ROUTINE W REFLEX MICROSCOPIC    EKG None  Radiology CT ABDOMEN PELVIS W CONTRAST  Result Date: 05/29/2023 CLINICAL DATA:  Abdominal pain, acute, nonlocalized EXAM: CT ABDOMEN AND PELVIS WITH CONTRAST TECHNIQUE: Multidetector CT imaging of the abdomen and pelvis was performed using the standard protocol following bolus administration of intravenous contrast. RADIATION DOSE REDUCTION: This exam was performed according to the departmental dose-optimization program which includes automated exposure control, adjustment of the mA and/or kV according to patient size and/or use of iterative reconstruction technique. CONTRAST:  OMNIPAQUE IOHEXOL 300 MG/ML  SOLN COMPARISON:  CT abdomen/pelvis dated December 09, 2014. FINDINGS: Lower chest: No acute abnormality. Hepatobiliary: No focal liver abnormality is seen. No gallstones, gallbladder wall thickening, or biliary dilatation. Pancreas: Unremarkable. No pancreatic ductal dilatation or surrounding inflammatory changes. Spleen: Normal in size without focal abnormality. Adrenals/Urinary Tract: Adrenal glands are unremarkable. Kidneys are normal, without renal calculi, suspicious focal lesion, or hydronephrosis. Bladder is unremarkable. Stomach/Bowel: The stomach is within normal limits. The appendix is not clearly identified, however, no pericecal inflammatory changes are noted. No evidence of obstruction. Descending and sigmoid colonic diverticulosis with fat stranding and wall thickening involving the proximal to mid descending colon with evidence of an inflamed diverticula (series 301, image 43 and series 602, image 133). There is edema within the left pericolic gutter. No loculated intraperitoneal fluid collections. No intraperitoneal free air. Vascular/Lymphatic: No significant vascular findings are present. No enlarged abdominal or pelvic lymph nodes. Reproductive: Prostate is  unremarkable. Other: Tiny fat containing umbilical hernia. Musculoskeletal: Degenerative disc changes at L5-S1 with disc height loss, endplate osteophytosis and disc desiccation. There is minimal grade 1 retrolisthesis of L5 on S1. No acute osseous abnormality. IMPRESSION: 1. Findings suggestive of acute diverticulitis involving the proximal to mid descending colon. No loculated fluid collection or evidence of perforation. There is edema within the subjacent left paracolic gutter. 2. Degenerative disc changes at L5-S1 with minimal grade 1 retrolisthesis of L5 on S1. Electronically Signed   By: Hart Robinsons M.D.   On: 05/29/2023 14:44    Procedures Procedures    Medications Ordered in ED Medications  iohexol (OMNIPAQUE) 300 MG/ML solution 100 mL (100 mLs Intravenous Contrast Given 05/29/23 1251)    ED Course/ Medical Decision Making/ A&P                                 Medical Decision Making Amount and/or Complexity of Data Reviewed Labs: ordered. Radiology: ordered.  Risk Prescription drug management.  Sean Roth is here with lower abdominal pain.  No significant medical history.  Normal vitals.  No fever.  Differential diagnosis diverticulitis versus less likely bowel obstruction kidney stone, could be colitis.  Will check CBC CMP lipase urinalysis and CT scan abdomen pelvis.    Per my review and interpretation labs no significant anemia or electrolyte abnormality or kidney injury or leukocytosis.  Urinalysis negative for infection.  Patient pending CT scan.  Patient with acute diverticulitis but no complications.  Will prescribe Augmentin and Zofran.  He understands return precautions including worsening pain, fever.  Discharged in good condition.  Will have him follow-up with primary care and gastroenterology.  This chart was dictated using voice recognition software.  Despite best efforts to proofread,  errors can occur which can change the documentation meaning.          Final Clinical Impression(s) / ED Diagnoses Final diagnoses:  Abdominal pain, unspecified abdominal location  Diverticulitis    Rx / DC Orders ED Discharge Orders          Ordered    amoxicillin-clavulanate (AUGMENTIN) 875-125 MG tablet  Every 12 hours        05/29/23 1454    ondansetron (ZOFRAN-ODT) 4 MG disintegrating tablet  Every 8 hours PRN        05/29/23 1454              Adalaya Irion, DO 05/29/23 1444    Delonna Ney, DO 05/29/23 1455

## 2023-05-29 NOTE — Discharge Instructions (Addendum)
I have prescribed you antibiotics and nausea medicine for diverticulitis.  Follow-up with your primary care doctor and gastroenterology.  Please return if you develop fever, worsening pain.  This should self resolve with antibiotics and time.  But as we discussed if you develop fever or worsening pain you should come back for reevaluation.
